# Patient Record
Sex: Female | Born: 1990 | Race: Black or African American | Hispanic: No | Marital: Single | Smoking: Former smoker
Health system: Southern US, Community
[De-identification: ages and names within clinical notes are randomized; demographics above are authoritative.]

## PROBLEM LIST (undated history)

## (undated) HISTORY — PX: SHOULDER SURGERY: SHX246

## (undated) HISTORY — PX: APPENDECTOMY: SHX54

---

## 2016-03-25 ENCOUNTER — Emergency Department: Payer: Medicaid - Out of State

## 2016-03-25 ENCOUNTER — Encounter: Payer: Self-pay | Admitting: Emergency Medicine

## 2016-03-25 ENCOUNTER — Emergency Department
Admission: EM | Admit: 2016-03-25 | Discharge: 2016-03-25 | Disposition: A | Discharge status: Home / Self Care | Payer: Medicaid - Out of State | Attending: Student in an Organized Health Care Education/Training Program | Admitting: Student in an Organized Health Care Education/Training Program

## 2016-03-25 DIAGNOSIS — Z87891 Personal history of nicotine dependence: Secondary | ICD-10-CM | POA: Diagnosis not present

## 2016-03-25 DIAGNOSIS — R1011 Right upper quadrant pain: Secondary | ICD-10-CM | POA: Diagnosis present

## 2016-03-25 DIAGNOSIS — R109 Unspecified abdominal pain: Secondary | ICD-10-CM

## 2016-03-25 DIAGNOSIS — N3000 Acute cystitis without hematuria: Secondary | ICD-10-CM | POA: Diagnosis not present

## 2016-03-25 LAB — COMPREHENSIVE METABOLIC PANEL
ALT: 17 U/L (ref 14–54)
AST: 18 U/L (ref 15–41)
Albumin: 4.1 g/dL (ref 3.5–5.0)
Alkaline Phosphatase: 55 U/L (ref 38–126)
Anion gap: 6 (ref 5–15)
BUN: 8 mg/dL (ref 6–20)
CHLORIDE: 105 mmol/L (ref 101–111)
CO2: 26 mmol/L (ref 22–32)
CREATININE: 0.59 mg/dL (ref 0.44–1.00)
Calcium: 9 mg/dL (ref 8.9–10.3)
GFR calc Af Amer: >60 mL/min (ref >60)
Glucose, Bld: 87 mg/dL (ref 65–99)
POTASSIUM: 3.6 mmol/L (ref 3.5–5.1)
SODIUM: 137 mmol/L (ref 135–145)
Total Bilirubin: 0.8 mg/dL (ref 0.3–1.2)
Total Protein: 7.5 g/dL (ref 6.5–8.1)

## 2016-03-25 LAB — LIPASE, BLOOD: LIPASE: 12 U/L (ref 11–51)

## 2016-03-25 LAB — URINALYSIS, COMPLETE (UACMP) WITH MICROSCOPIC
BILIRUBIN URINE: NEGATIVE
Glucose, UA: NEGATIVE mg/dL
Hgb urine dipstick: NEGATIVE
KETONES UR: NEGATIVE mg/dL
Nitrite: POSITIVE — AB
Protein, ur: NEGATIVE mg/dL
Specific Gravity, Urine: 1.02 (ref 1.005–1.030)
pH: 6 (ref 5.0–8.0)

## 2016-03-25 LAB — CBC
HEMATOCRIT: 33.2 % — AB (ref 35.0–47.0)
Hemoglobin: 11 g/dL — ABNORMAL LOW (ref 12.0–16.0)
MCH: 25.8 pg — AB (ref 26.0–34.0)
MCHC: 33 g/dL (ref 32.0–36.0)
MCV: 78.2 fL — AB (ref 80.0–100.0)
PLATELETS: 161 10*3/uL (ref 150–440)
RBC: 4.25 MIL/uL (ref 3.80–5.20)
RDW: 17 % — AB (ref 11.5–14.5)
WBC: 6.1 10*3/uL (ref 3.6–11.0)

## 2016-03-25 LAB — POCT PREGNANCY, URINE: PREG TEST UR: NEGATIVE

## 2016-03-25 MED ORDER — DICYCLOMINE HCL 10 MG PO CAPS: 10.0000 mg | ORAL_CAPSULE | Freq: Three times a day (TID) | ORAL | 0 refills | Status: AC | PRN

## 2016-03-25 MED ORDER — DICYCLOMINE HCL 10 MG PO CAPS
ORAL_CAPSULE | ORAL | Status: AC
  Administered 2016-03-25: 10 mg via ORAL
  Filled 2016-03-25: qty 1

## 2016-03-25 MED ORDER — PROMETHAZINE HCL 25 MG PO TABS
ORAL_TABLET | ORAL | Status: AC
  Administered 2016-03-25: 12.5 mg via ORAL
  Filled 2016-03-25: qty 1

## 2016-03-25 MED ORDER — PROMETHAZINE HCL 25 MG PO TABS
12.5000 mg | ORAL_TABLET | Freq: Once | ORAL | Status: AC
  Administered 2016-03-25: 12.5 mg via ORAL

## 2016-03-25 MED ORDER — NITROFURANTOIN MONOHYD MACRO 100 MG PO CAPS: 100.0000 mg | ORAL_CAPSULE | Freq: Two times a day (BID) | ORAL | 0 refills | Status: AC

## 2016-03-25 MED ORDER — NITROFURANTOIN MONOHYD MACRO 100 MG PO CAPS
ORAL_CAPSULE | ORAL | Status: AC
  Administered 2016-03-25: 100 mg via ORAL
  Filled 2016-03-25: qty 6

## 2016-03-25 MED ORDER — NITROFURANTOIN MONOHYD MACRO 100 MG PO CAPS
100.0000 mg | ORAL_CAPSULE | Freq: Once | ORAL | Status: AC
Start: 2016-03-25 — End: 2016-03-25
  Administered 2016-03-25: 100 mg via ORAL
  Filled 2016-03-25: qty 1

## 2016-03-25 MED ORDER — DICYCLOMINE HCL 10 MG PO CAPS
10.0000 mg | ORAL_CAPSULE | Freq: Once | ORAL | Status: AC
  Administered 2016-03-25: 10 mg via ORAL

## 2016-03-25 MED ORDER — PROMETHAZINE HCL 12.5 MG PO TABS: 12.5000 mg | ORAL_TABLET | Freq: Four times a day (QID) | ORAL | 0 refills | Status: AC | PRN

## 2016-03-25 NOTE — ED Notes (Signed)
Patient denies any change in symptoms since triage.  States that she has had pain similar to this before and thinks it may be her GB.

## 2016-03-25 NOTE — Discharge Instructions (Signed)

## 2016-03-25 NOTE — ED Provider Notes (Signed)
[facility] Emergency Department Provider Note    First MD Initiated Contact with Patient 03/25/16 1021     (approximate)  I have reviewed the triage vital signs and the nursing notes.   HISTORY  Chief Complaint Abdominal Pain    HPI Christine Williamson is a 26 y.o. female complaint of 2-3 days of right quadrant abdominal pain associated with eating. No fevers or chills. Admits to significant nausea tender 15 minutes after eating. Particularly with greasy foods. States that she had a history of issues with her gallbladder. No known stones. Does have significant family history of gallbladder disease. Is status post appendectomy. Denies any dysuria or flank pain but has had increased concentration and urinary frequency. Denies any vaginal bleeding or pelvic pain.   History reviewed. No pertinent past medical history. History reviewed. No pertinent family history. Past Surgical History:  Procedure Laterality Date  . APPENDECTOMY    . SHOULDER SURGERY     There are no active problems to display for this patient.     Prior to Admission medications   Not on File    Allergies Patient has no known allergies.    Social History Social History  Substance Use Topics  . Smoking status: Former Games developer  . Smokeless tobacco: Never Used  . Alcohol use No    Review of Systems Patient denies headaches, rhinorrhea, blurry vision, numbness, shortness of breath, chest pain, edema, cough, abdominal pain, nausea, vomiting, diarrhea, dysuria, fevers, rashes or hallucinations unless otherwise stated above in HPI. ____________________________________________   PHYSICAL EXAM:  VITAL SIGNS: Vitals:   03/25/16 0919  BP: (!) 125/92  Pulse: 75  Resp: 16  Temp: 97.9 F (36.6 C)    Constitutional: Alert and oriented. Well appearing and in no acute distress. Eyes: Conjunctivae are normal. PERRL. EOMI. Head: Atraumatic. Nose: No  congestion/rhinnorhea. Mouth/Throat: Mucous membranes are moist.  Oropharynx non-erythematous. Neck: No stridor. Painless ROM. No cervical spine tenderness to palpation Hematological/Lymphatic/Immunilogical: No cervical lymphadenopathy. Cardiovascular: Normal rate, regular rhythm. Grossly normal heart sounds.  Good peripheral circulation. Respiratory: Normal respiratory effort.  No retractions. Lungs CTAB. Gastrointestinal: Soft and nontender. No distention. No abdominal bruits. No CVA tenderness. Genitourinary:  Musculoskeletal: No lower extremity tenderness nor edema.  No joint effusions. Neurologic:  Normal speech and language. No gross focal neurologic deficits are appreciated. No gait instability. Skin:  Skin is warm, dry and intact. No rash noted. Psychiatric: Mood and affect are normal. Speech and behavior are normal.  ____________________________________________   LABS (all labs ordered are listed, but only abnormal results are displayed)  Results for orders placed or performed during the hospital encounter of 03/25/16 (from the past 24 hour(s))  Lipase, blood     Status: None   Collection Time: 03/25/16  9:18 AM  Result Value Ref Range   Lipase 12 11 - 51 U/L  Comprehensive metabolic panel     Status: None   Collection Time: 03/25/16  9:18 AM  Result Value Ref Range   Sodium 137 135 - 145 mmol/L   Potassium 3.6 3.5 - 5.1 mmol/L   Chloride 105 101 - 111 mmol/L   CO2 26 22 - 32 mmol/L   Glucose, Bld 87 65 - 99 mg/dL   BUN 8 6 - 20 mg/dL   Creatinine, Ser 0.98 0.44 - 1.00 mg/dL   Calcium 9.0 8.9 - 11.9 mg/dL   Total Protein 7.5 6.5 - 8.1 g/dL   Albumin 4.1 3.5 - 5.0 g/dL   AST 18  15 - 41 U/L   ALT 17 14 - 54 U/L   Alkaline Phosphatase 55 38 - 126 U/L   Total Bilirubin 0.8 0.3 - 1.2 mg/dL   GFR calc non Af Amer >60 >60 mL/min   GFR calc Af Amer >60 >60 mL/min   Anion gap 6 5 - 15  CBC     Status: Abnormal   Collection Time: 03/25/16  9:18 AM  Result Value Ref Range    WBC 6.1 3.6 - 11.0 K/uL   RBC 4.25 3.80 - 5.20 MIL/uL   Hemoglobin 11.0 (L) 12.0 - 16.0 g/dL   HCT 40.9 (L) 81.1 - 91.4 %   MCV 78.2 (L) 80.0 - 100.0 fL   MCH 25.8 (L) 26.0 - 34.0 pg   MCHC 33.0 32.0 - 36.0 g/dL   RDW 78.2 (H) 95.6 - 21.3 %   Platelets 161 150 - 440 K/uL  Urinalysis, Complete w Microscopic     Status: Abnormal   Collection Time: 03/25/16  9:18 AM  Result Value Ref Range   Color, Urine YELLOW (A) YELLOW   APPearance HAZY (A) CLEAR   Specific Gravity, Urine 1.020 1.005 - 1.030   pH 6.0 5.0 - 8.0   Glucose, UA NEGATIVE NEGATIVE mg/dL   Hgb urine dipstick NEGATIVE NEGATIVE   Bilirubin Urine NEGATIVE NEGATIVE   Ketones, ur NEGATIVE NEGATIVE mg/dL   Protein, ur NEGATIVE NEGATIVE mg/dL   Nitrite POSITIVE (A) NEGATIVE   Leukocytes, UA TRACE (A) NEGATIVE   RBC / HPF 0-5 0 - 5 RBC/hpf   WBC, UA 6-30 0 - 5 WBC/hpf   Bacteria, UA RARE (A) NONE SEEN   Squamous Epithelial / LPF 0-5 (A) NONE SEEN   Mucous PRESENT    ____________________________________________  EKG  ____________________________________________  RADIOLOGY  I personally reviewed all radiographic images ordered to evaluate for the above acute complaints and reviewed radiology reports and findings.  These findings were personally discussed with the patient.  Please see medical record for radiology report.  ____________________________________________   PROCEDURES  Procedure(s) performed:  Procedures    Critical Care performed: no ____________________________________________   INITIAL IMPRESSION / ASSESSMENT AND PLAN / ED COURSE  Pertinent labs & imaging results that were available during my care of the patient were reviewed by me and considered in my medical decision making (see chart for details).  DDX: cholelithiasis, cholecystitis, dehydration, gastritis, uti, pyelo  Christine Williamson is a 26 y.o. who presents to the ED with complaints as described above.  Patient is AFVSS in ED. Exam  as above. Given current presentation have considered the above differential.  Abdominal exam of his with mild tenderness but no guarding or rebound. Blood work is otherwise reassuring. Based on duration of her symptoms and concern for cholelithiasis will right upper quadrant ultrasound to evaluate. Patient also with evidence of UTI. We'll provide Macrobid as she does not have any evidence of pyelonephritis.  Clinical Course as of Mar 26 1131  Tue Mar 25, 2016  1121 Ultrasound is reassuring. Patient otherwise hemodynamic stable. Provide symptomatically treatment and referral for further workup as an outpatient.  Patient was able to tolerate PO and was able to ambulate with a steady gait.  Have discussed with the patient and available family all diagnostics and treatments performed thus far and all questions were answered to the best of my ability. The patient demonstrates understanding and agreement with plan.   [PR]    Clinical Course User Index [PR] Willy Eddy, MD  ____________________________________________   FINAL CLINICAL IMPRESSION(S) / ED DIAGNOSES  Final diagnoses:  Abdominal pain  Acute cystitis without hematuria  RUQ abdominal pain      NEW MEDICATIONS STARTED DURING THIS VISIT:  New Prescriptions   No medications on file     Note:  This document was prepared using Dragon voice recognition software and may include unintentional dictation errors.    Willy EddyPatrick Lauren Aguayo, MD 03/25/16 [phone]

## 2016-03-25 NOTE — ED Triage Notes (Signed)
Pt c/o abdominal pain she thinks is her gallbladder again. They were going to take out but she was pregnant so did not at the time. Pain X 2 days worse after eating. Has had nausea but no vomiting. No distress at this time.

## 2017-06-17 IMAGING — US US ABDOMEN LIMITED
1 series · 14 of 25 positions shown · non-contrast
Comparison: None in PACs

CLINICAL DATA: Abdominal pain worsening for the past 3 days.
Similar symptoms intermittently since [DATE]. History of gallstones
according to the patient.

EXAM:
US ABDOMEN LIMITED - RIGHT UPPER QUADRANT

[Series 1: us abdomen limited · 0.15mm/px · 14 of 62 slices shown]
[im 1/62]
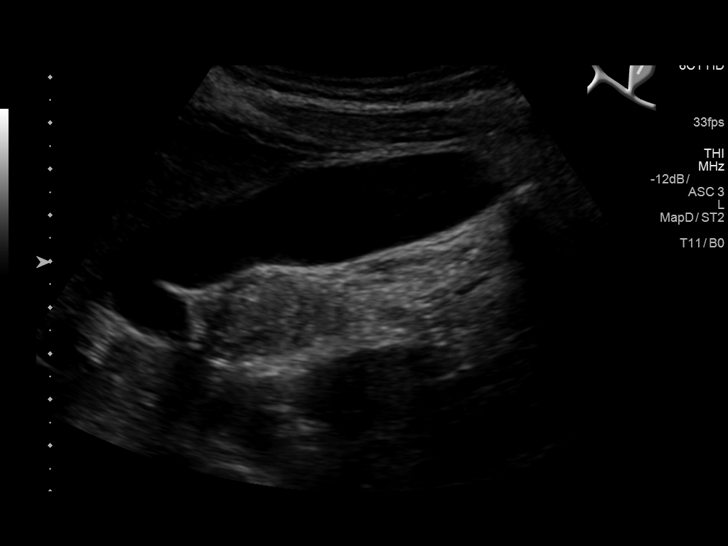
[im 6/62]
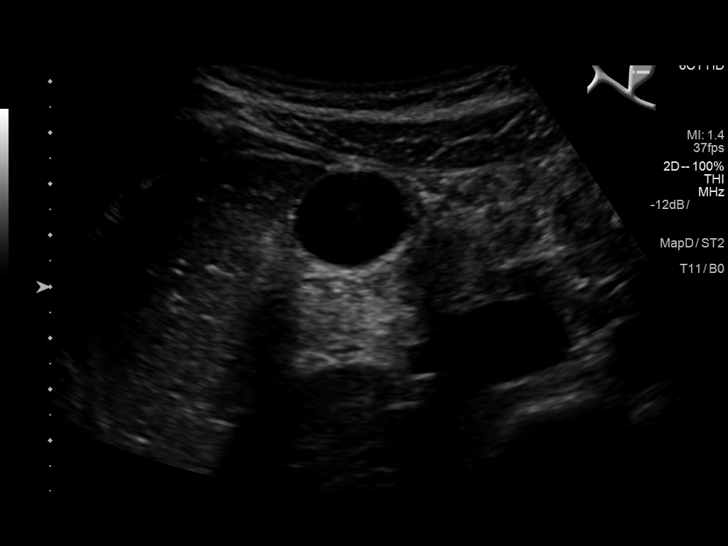
[im 11/62]
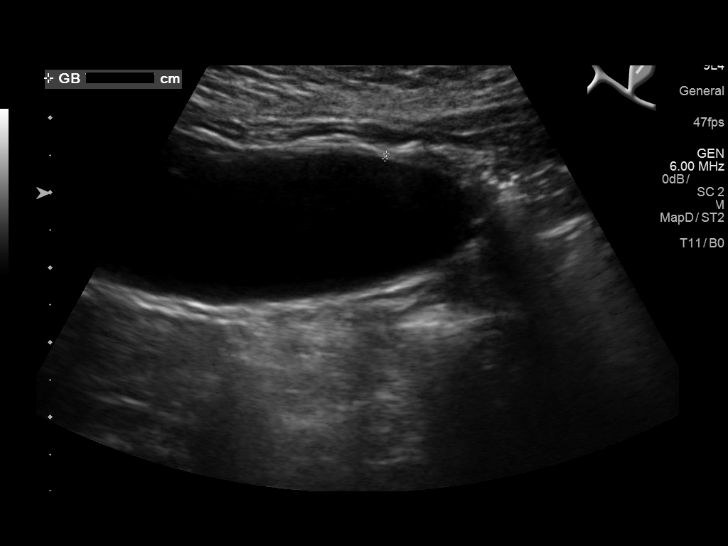
[im 16/62]
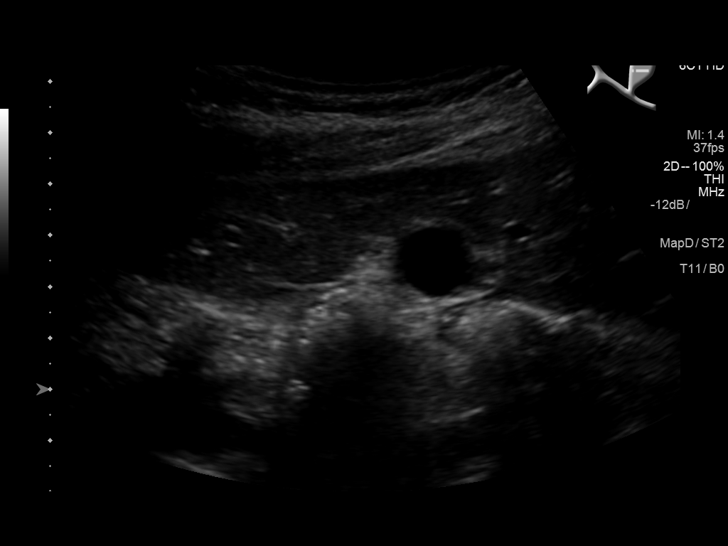
[im 21/62]
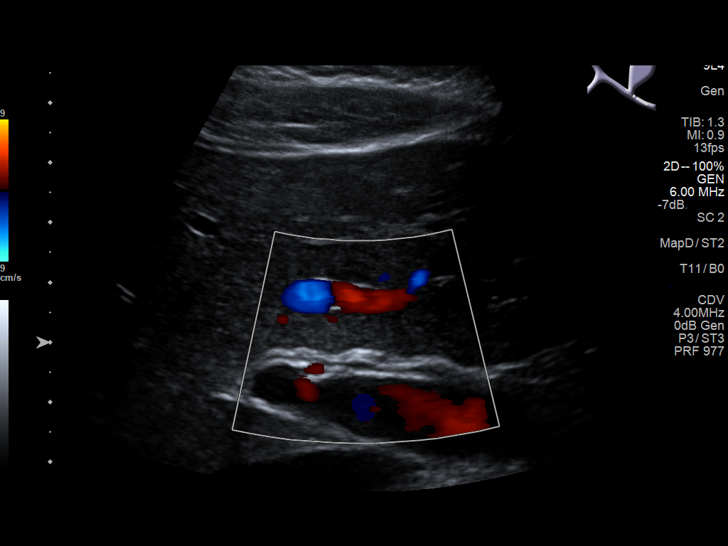
[im 23/62]
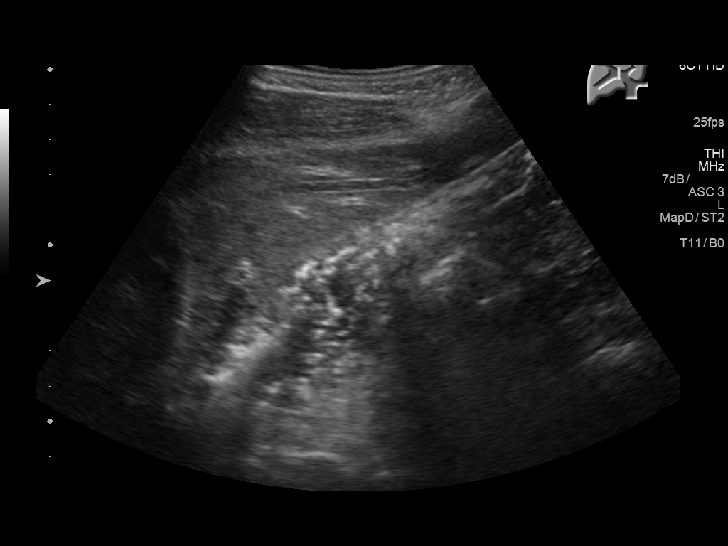
[im 28/62]
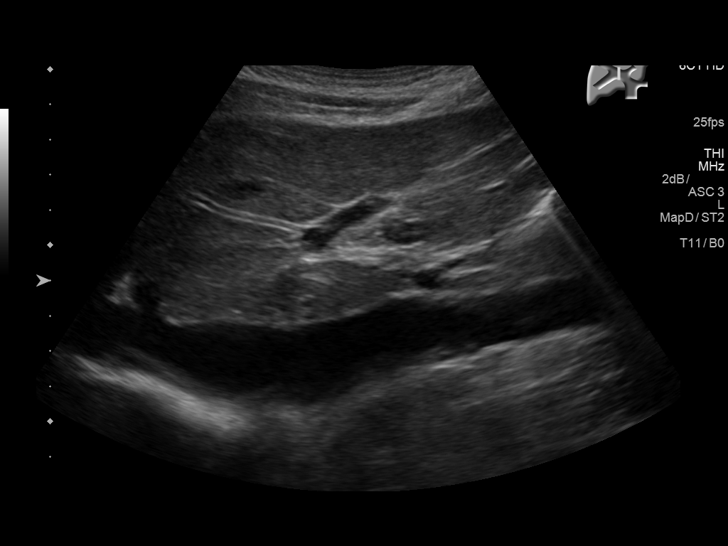
[im 34/62]
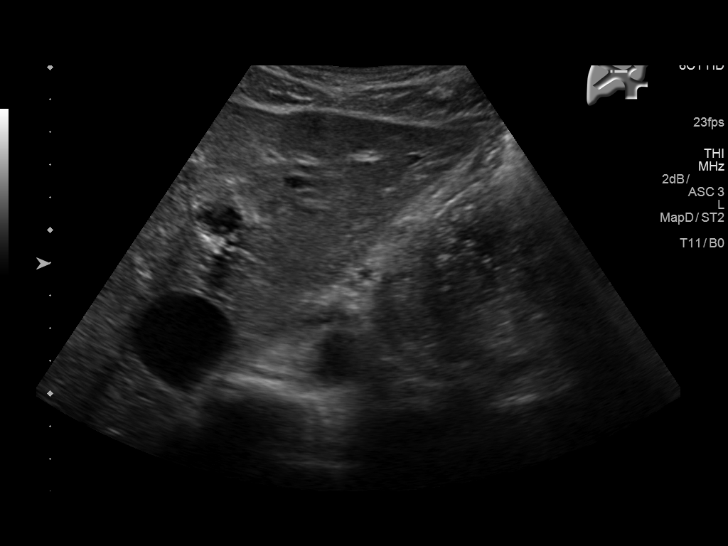
[im 39/62]
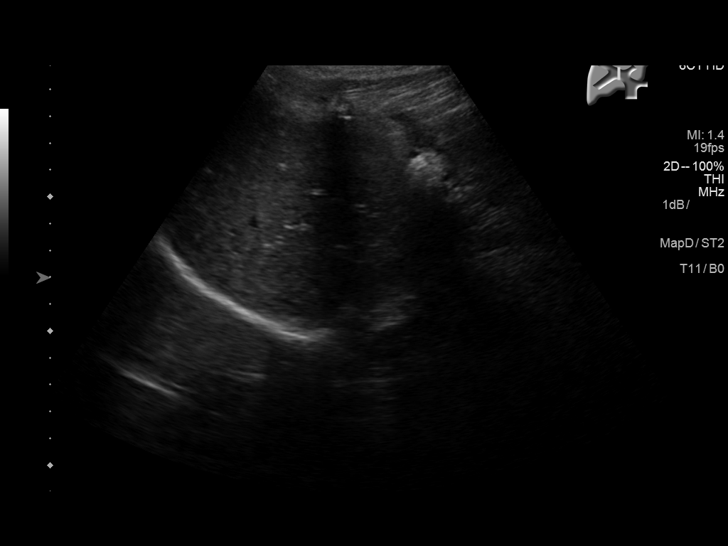
[im 41/62]
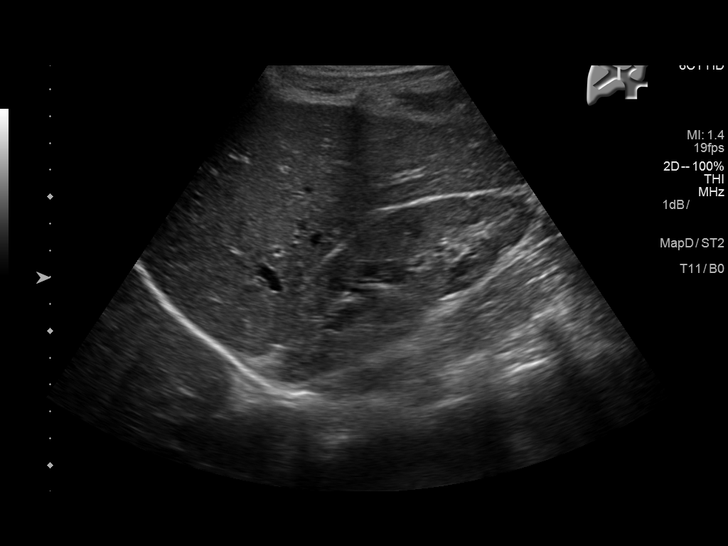
[im 46/62]
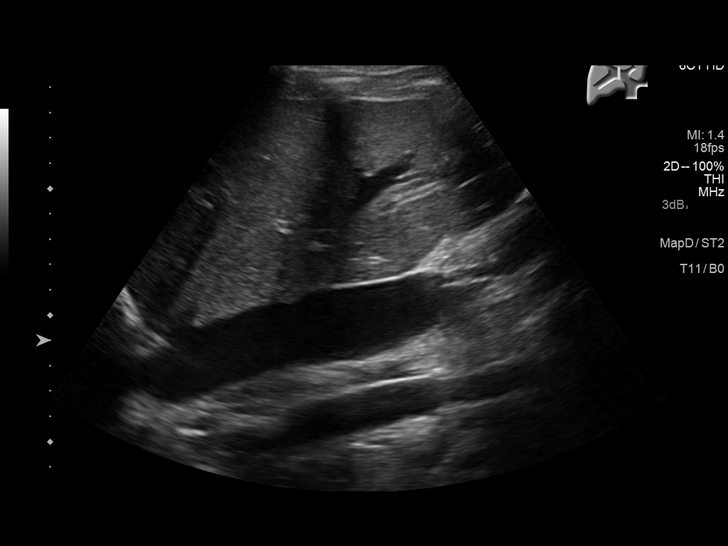
[im 51/62]
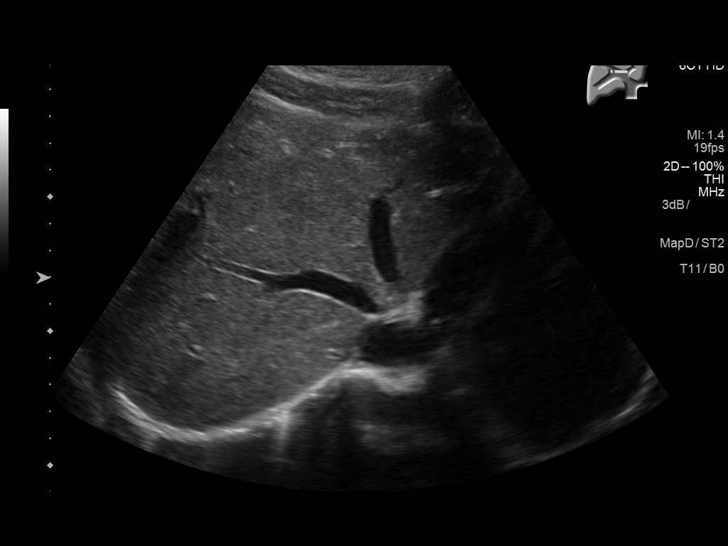
[im 56/62]
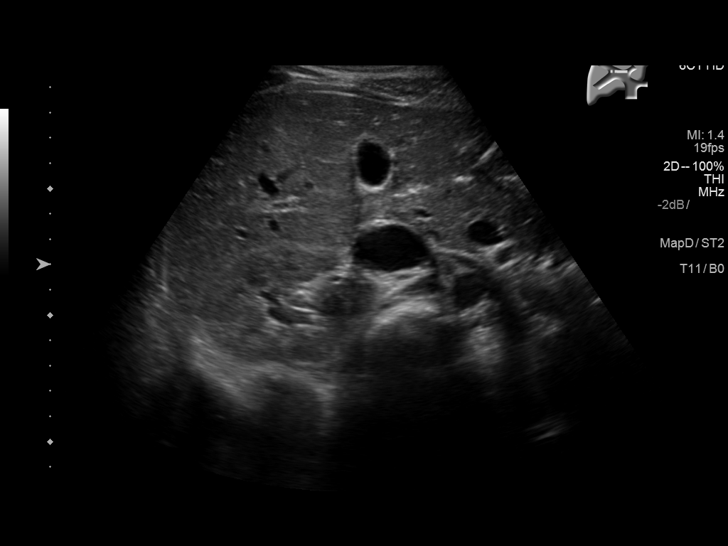
[im 62/62]
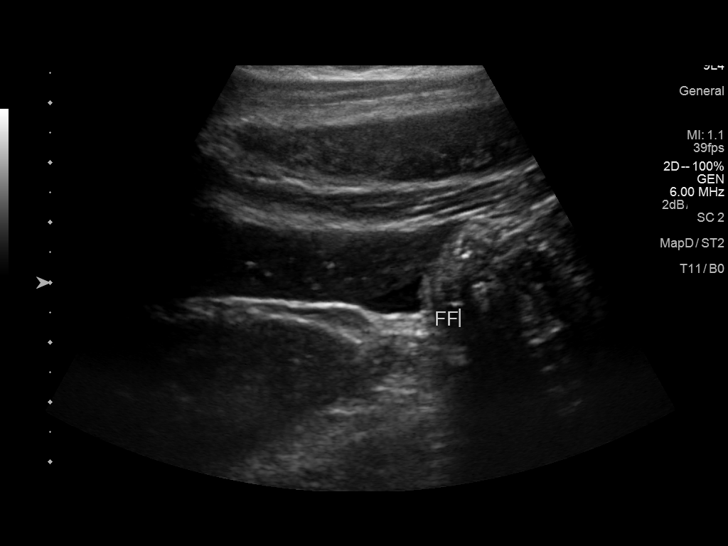

[14 of 25 positions shown; findings below may reference images not displayed]

FINDINGS: Gallbladder:

The gallbladder is adequately distended with no evidence of stones,
wall thickening, or pericholecystic fluid. No sludge or polyps are
observed. There is no positive sonographic Murphy's sign.

Common bile duct:

Diameter: Just under 1 mm.  No intraluminal stones are observed.

Liver:

The hepatic echotexture is normal. There is no focal mass nor ductal
dilation.
IMPRESSION: No gallstones or sonographic evidence of acute cholecystitis. If
there are clinical concerns of chronic gallbladder dysfunction, a
nuclear medicine hepatobiliary scan with gallbladder ejection
fraction determination may be useful.

Normal appearance of the liver and common bile duct.
